# Patient Record
Sex: Female | Born: 1945 | Race: Black or African American | Hispanic: No | Marital: Married | Smoking: Never smoker
Health system: Southern US, Community
[De-identification: ages and names within clinical notes are randomized; demographics above are authoritative.]

## PROBLEM LIST (undated history)

## (undated) HISTORY — PX: BREAST BIOPSY: SHX20

## (undated) HISTORY — PX: ABDOMINAL HYSTERECTOMY: SHX81

---

## 2005-08-24 ENCOUNTER — Ambulatory Visit: Payer: Self-pay

## 2005-09-19 ENCOUNTER — Ambulatory Visit: Payer: Self-pay

## 2006-04-03 ENCOUNTER — Ambulatory Visit: Payer: Self-pay

## 2006-04-11 IMAGING — US ULTRASOUND LEFT BREAST
1 series · 14 of 14 positions shown · non-contrast
Comparison: none

REASON FOR EXAM: Density  US PRN
COMMENTS:

[Series 1: ultrasound left breast · 14 of 14 slices shown]
[im 1/14]
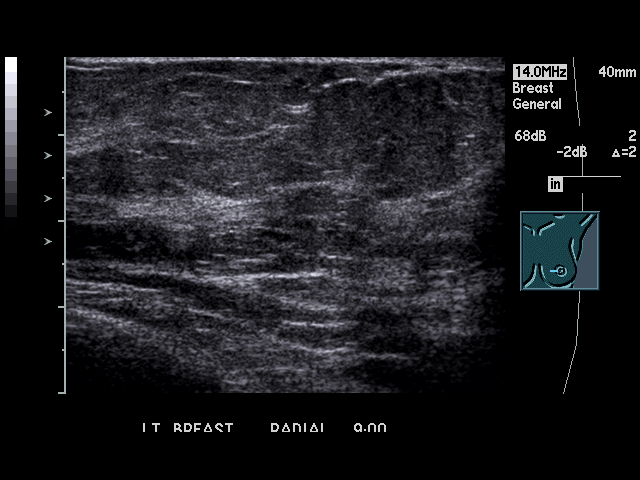
[im 2/14]
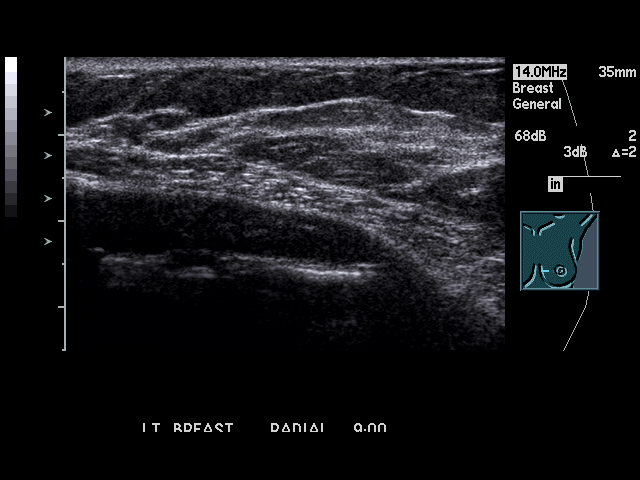
[im 3/14]
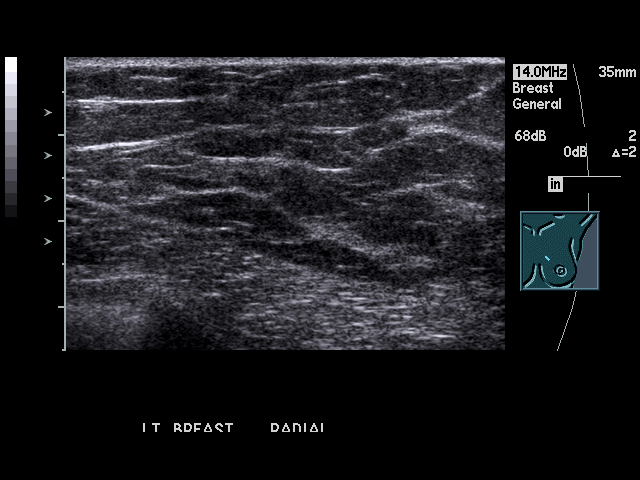
[im 4/14]
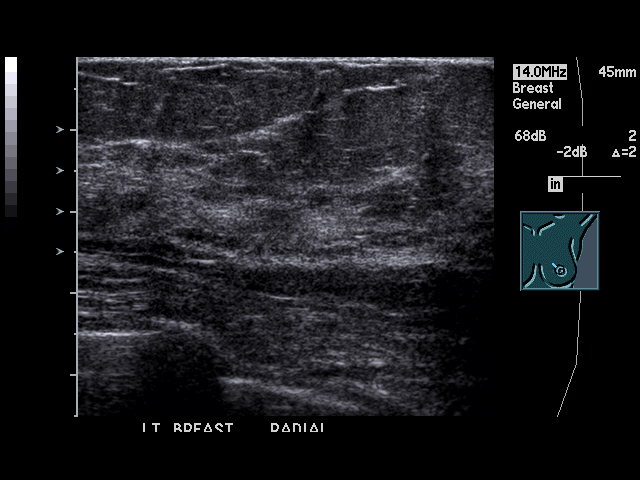
[im 5/14]
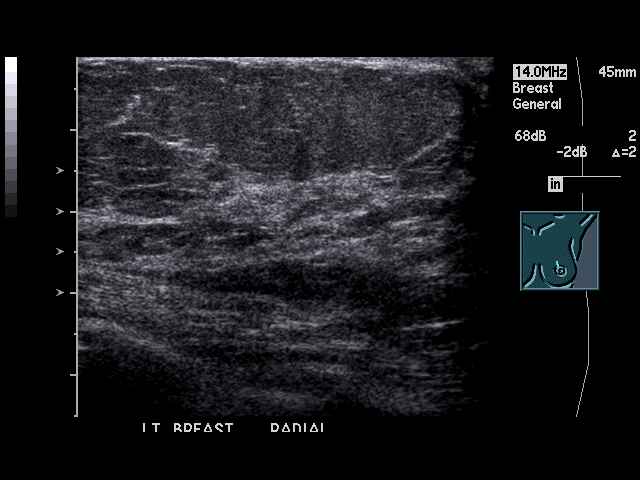
[im 6/14]
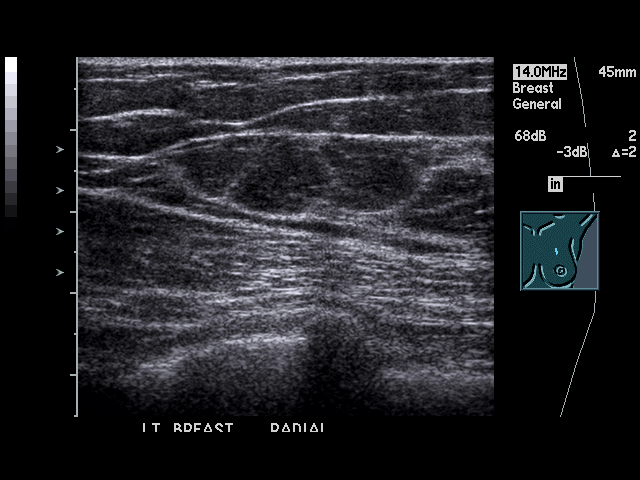
[im 7/14]
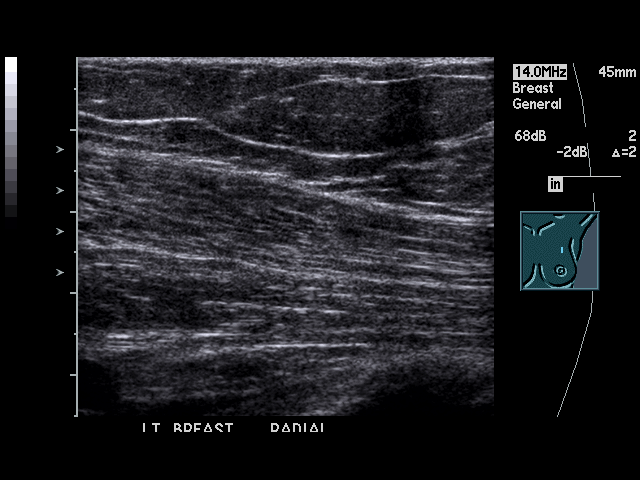
[im 8/14]
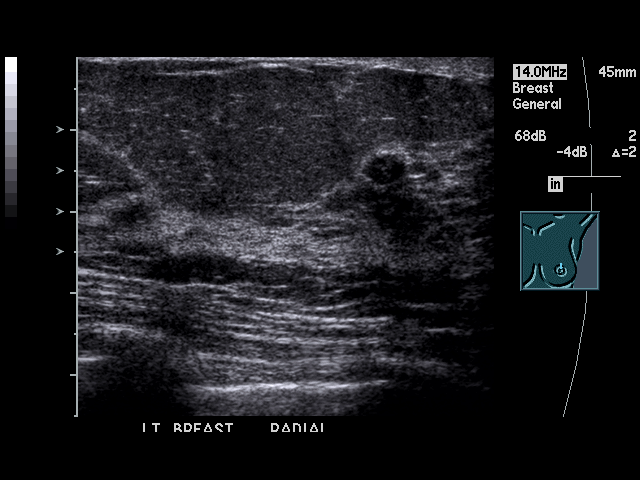
[im 9/14]
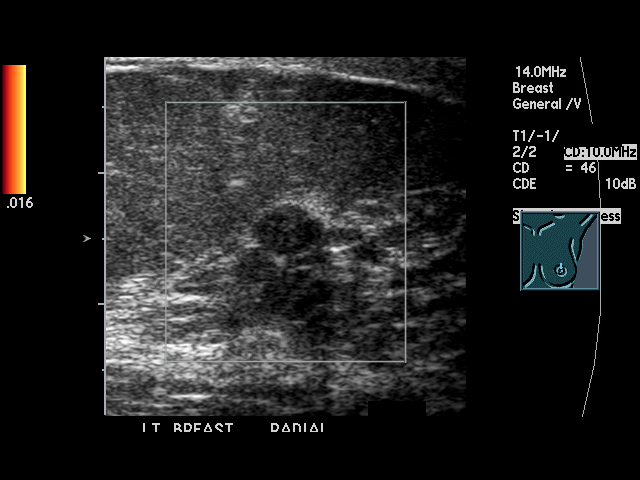
[im 10/14]
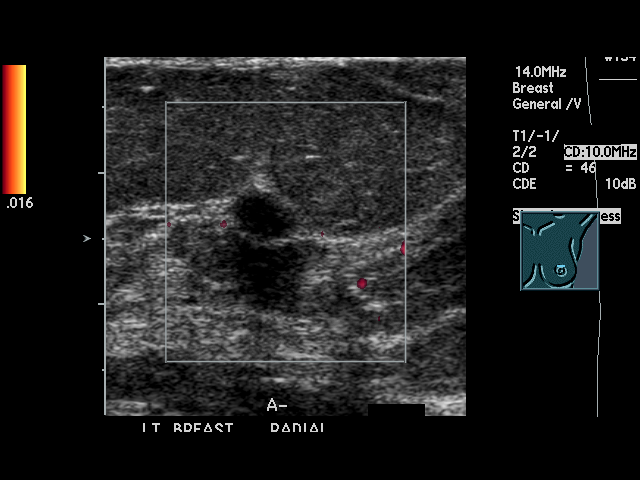
[im 11/14]
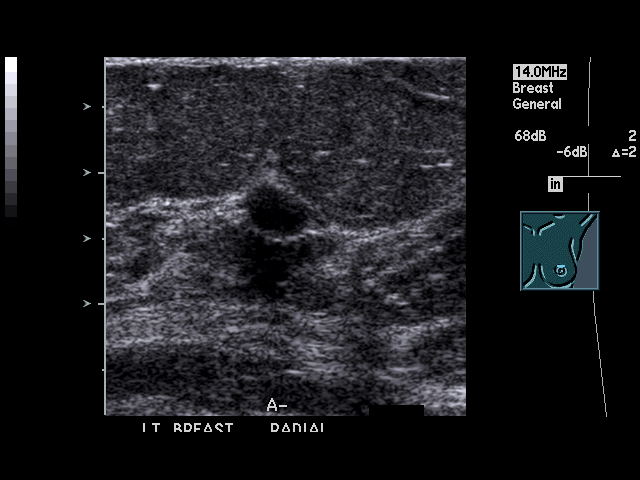
[im 12/14]
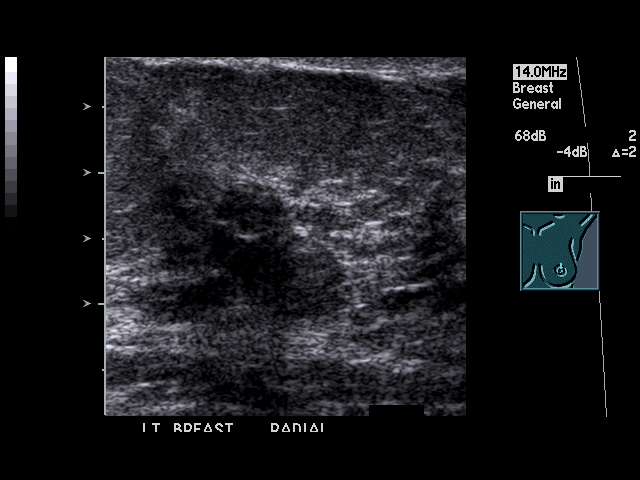
[im 13/14]
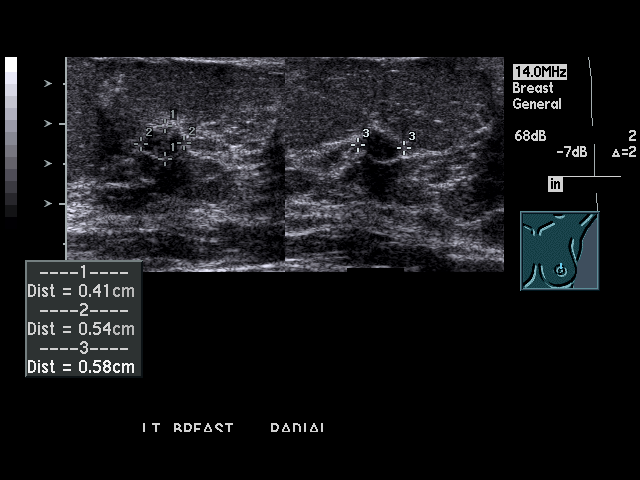
[im 14/14]
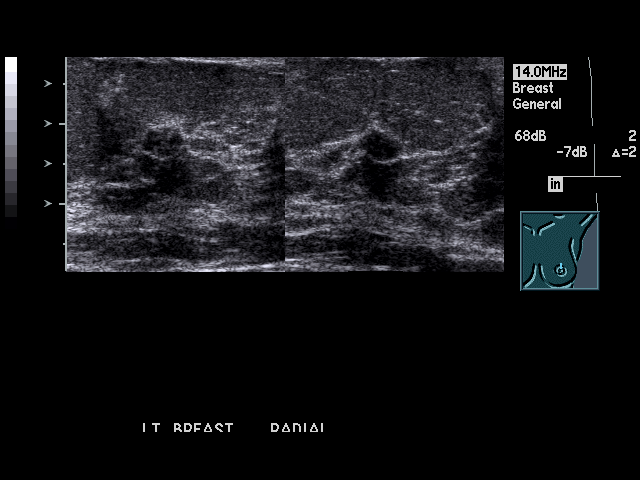

[14 of 14 positions shown; findings below may reference images not displayed]

PROCEDURE:     US  - US LT BREAST ([REDACTED])  - September 19, 2005 [DATE]

RESULT:          Limited sonographic evaluation of the LEFT breast
demonstrates a fairly well circumscribed area of hypoechogenicity measuring
approximately 4.1 x 5.4 x 5.8 mm at approximately the 12 o'clock position in
the deeper portion of the LEFT breast essentially at the level of the
nipple.  On power Doppler interrogation, no definite signal is seen within
the lesion, but there is a suggestion of some shadowing deeper to it.
IMPRESSION: BI-RADS:  Category 4 - Suspicious abnormality.  The
lesion sonographically has an indeterminate appearance but is of concern
mammographically.

## 2006-09-26 ENCOUNTER — Ambulatory Visit: Payer: Self-pay

## 2006-10-10 ENCOUNTER — Ambulatory Visit: Payer: Self-pay | Admitting: Unknown Physician Specialty

## 2007-03-21 ENCOUNTER — Ambulatory Visit: Payer: Self-pay

## 2007-05-02 IMAGING — US US PELV - US TRANSVAGINAL
1 series · 17 of 25 positions shown · non-contrast
Comparison: none

REASON FOR EXAM: pelvic mass
COMMENTS:

[Series 1: us pelv - us transvaginal · 17 of 27 slices shown]
[im 1/27]
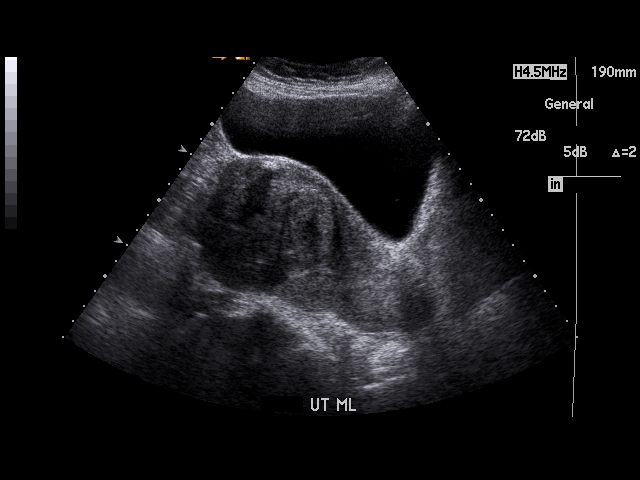
[im 3/27]
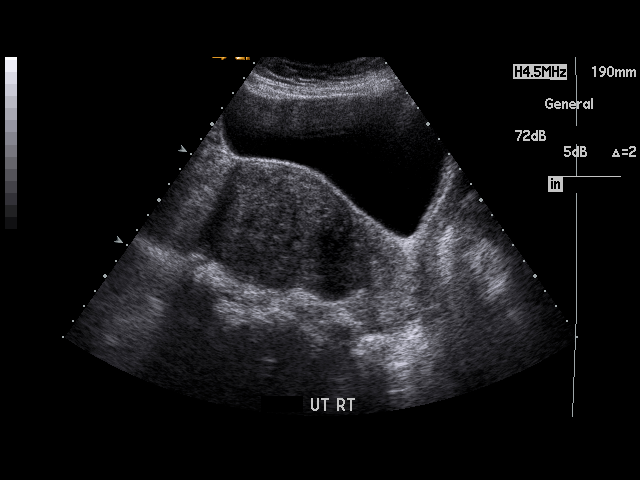
[im 4/27]
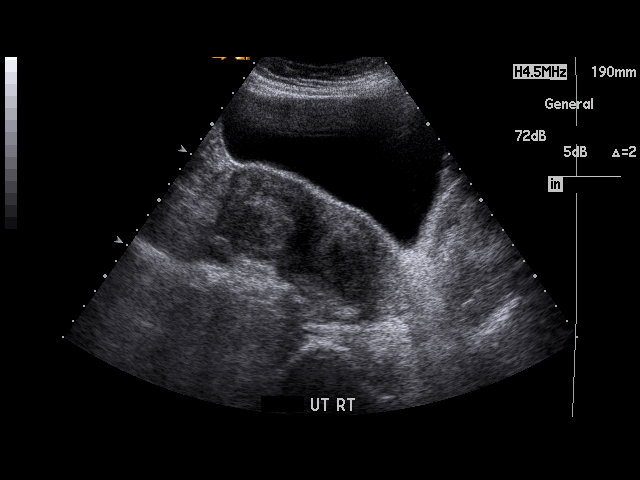
[im 6/27]
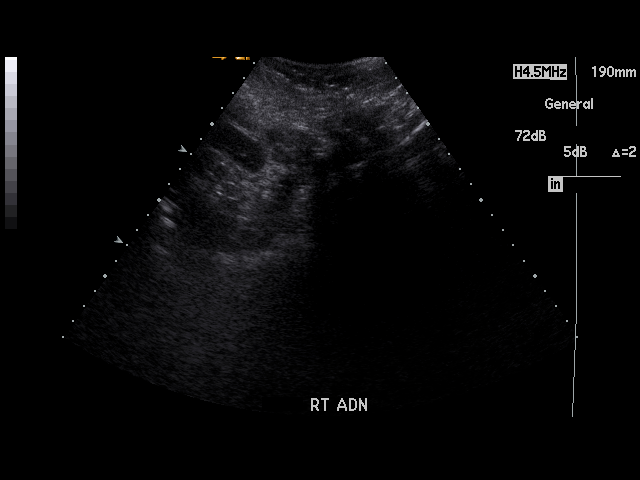
[im 7/27]
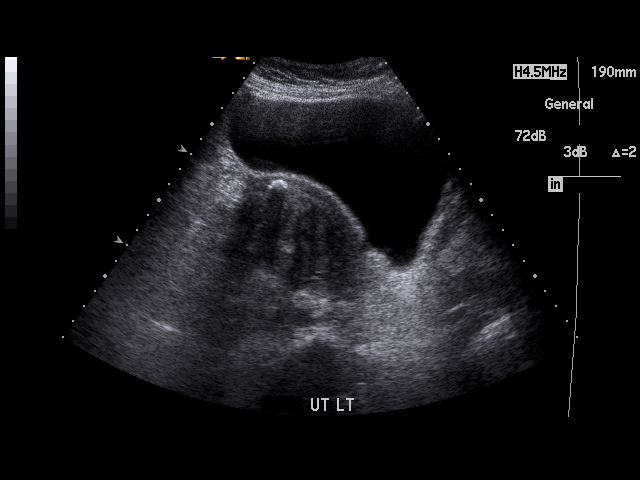
[im 9/27]
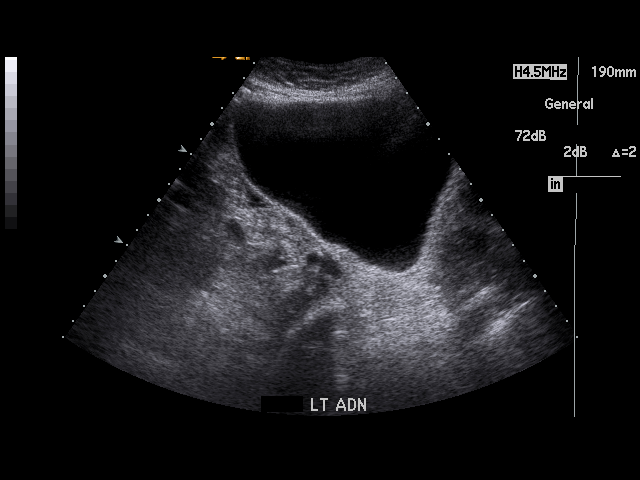
[im 10/27]
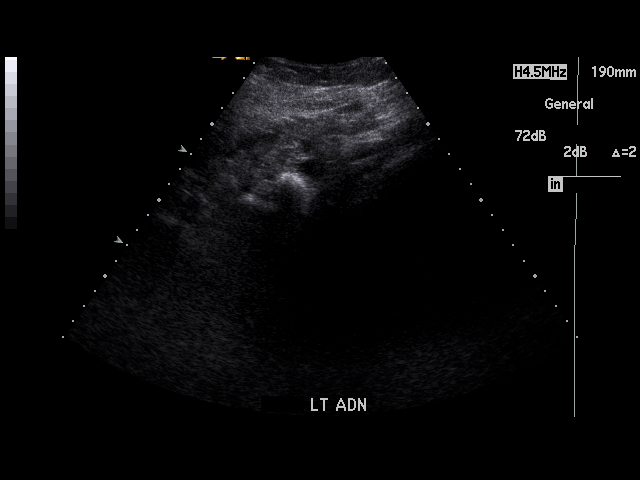
[im 12/27]
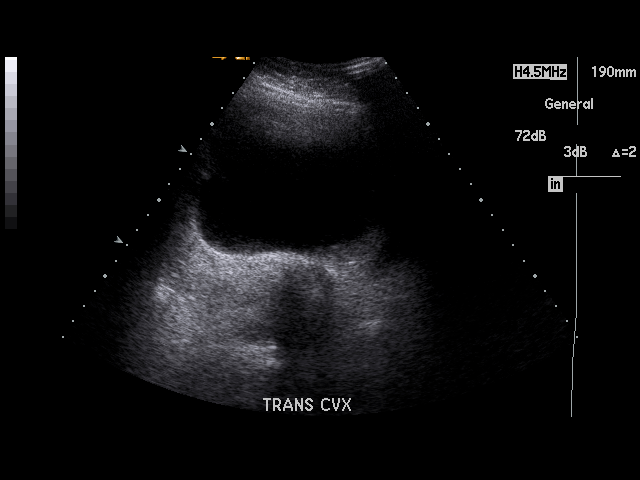
[im 14/27]
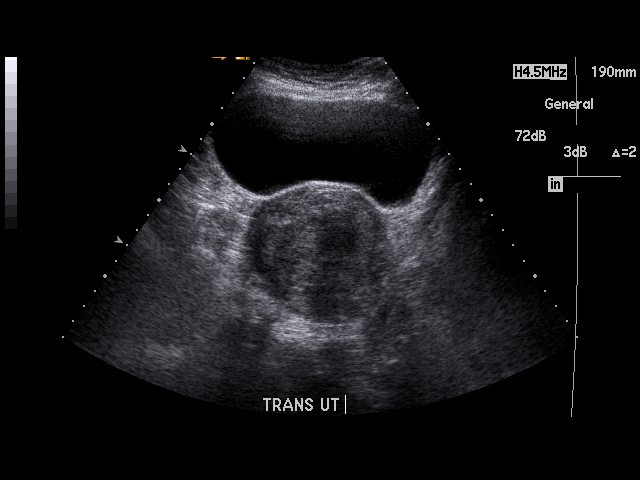
[im 15/27]
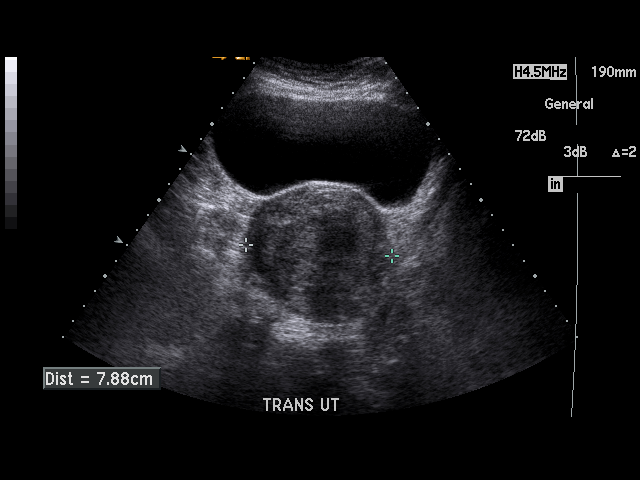
[im 17/27]
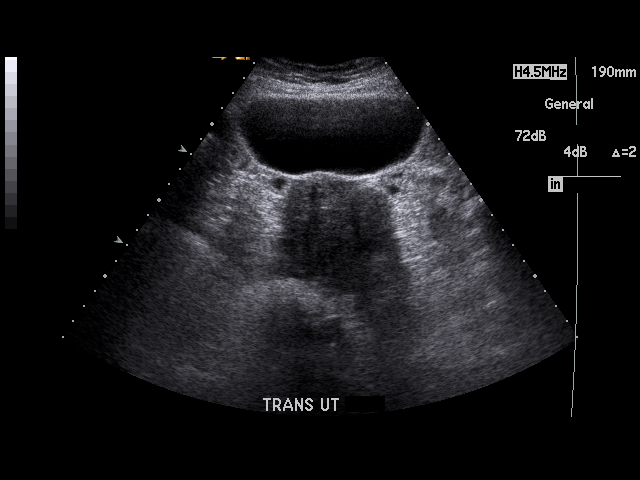
[im 18/27]
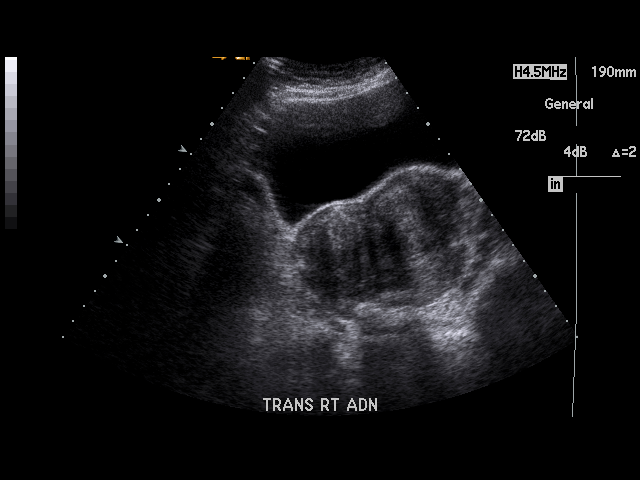
[im 20/27]
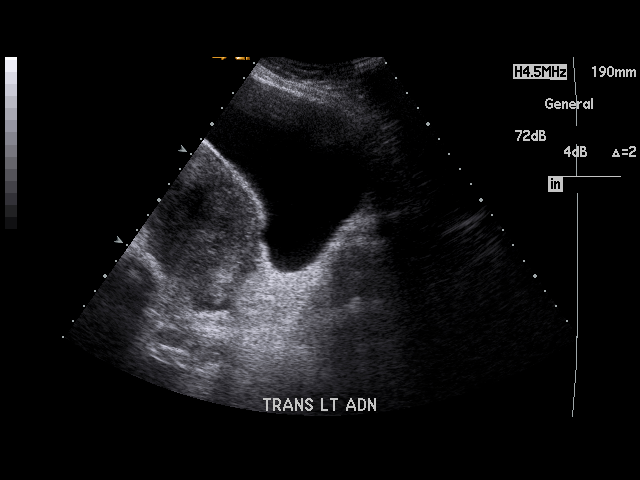
[im 21/27]
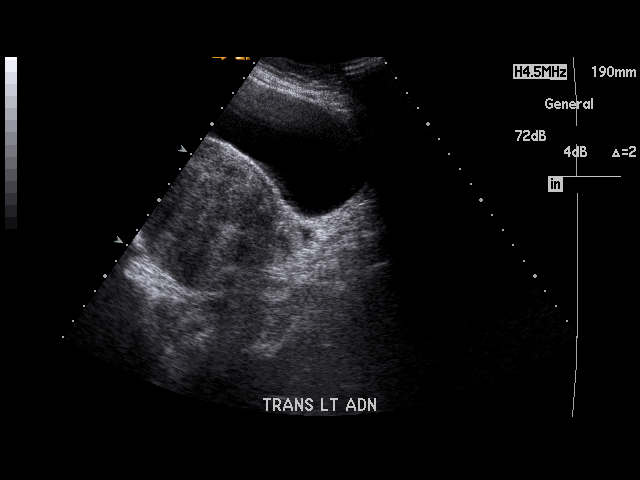
[im 23/27]
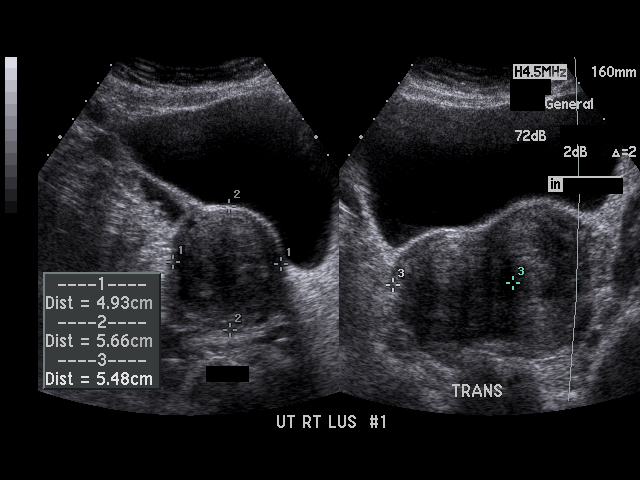
[im 24/27]
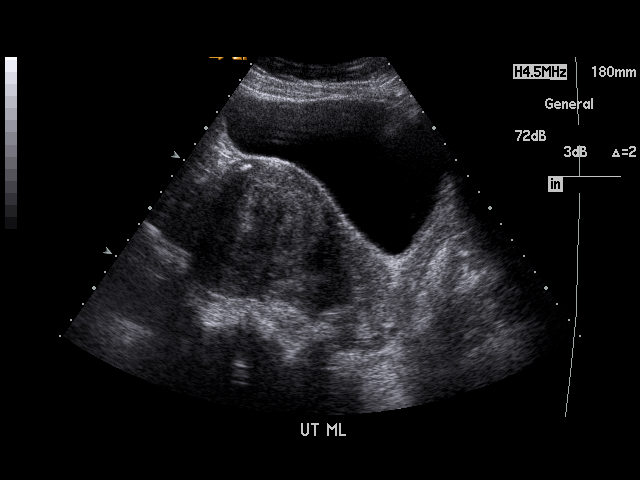
[im 27/27]
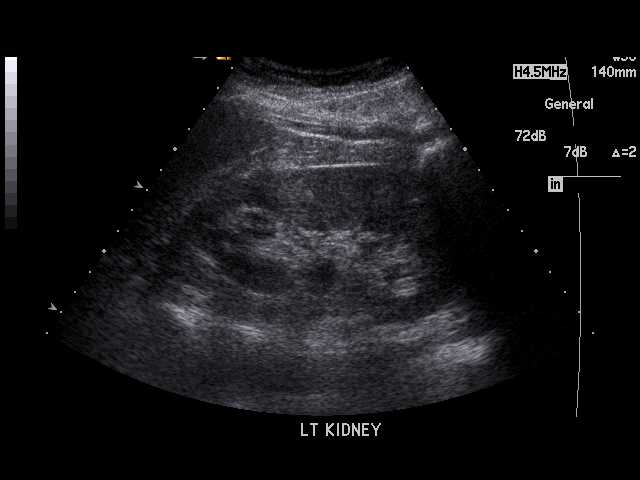

[17 of 25 positions shown; findings below may reference images not displayed]

PROCEDURE:     US  - US PELVIS MASS EXAM  - [DATE]  [DATE] [DATE] [DATE]

RESULT:     The uterus is mildly enlarged measuring 13.7 cm in length by
x 7.3 cm. Its echotexture is heterogeneous likely secondary to the presence
of multiple fibroids. The endometrial stripe could not be adequately
demonstrated due to shadowing. The adnexal structures could not be
discretely identified which is not unusual for a patient of 60 years. There
is no free fluid in the pelvis.
IMPRESSION: 1. The uterus is enlarged and heterogeneous in echotexture. There are likely
multiple fibroids. One which could be measured was approximately 5.0 cm in
diameter. MRI may be of value.
2. No adnexal masses were seen but the ovaries could not be discretely
identified. There is no free fluid in the pelvis.

## 2008-01-04 ENCOUNTER — Ambulatory Visit: Payer: Self-pay | Admitting: Unknown Physician Specialty

## 2008-01-21 ENCOUNTER — Encounter: Admission: RE | Admit: 2008-01-21 | Discharge: 2008-01-21 | Payer: Self-pay | Admitting: Family Medicine

## 2016-03-22 ENCOUNTER — Other Ambulatory Visit: Payer: Self-pay | Admitting: Family Medicine

## 2016-03-22 DIAGNOSIS — Z1231 Encounter for screening mammogram for malignant neoplasm of breast: Secondary | ICD-10-CM

## 2016-04-06 ENCOUNTER — Ambulatory Visit: Admission: RE | Admit: 2016-04-06 | Payer: Self-pay | Source: Ambulatory Visit

## 2016-05-03 ENCOUNTER — Other Ambulatory Visit: Payer: Self-pay | Admitting: Family Medicine

## 2016-05-03 ENCOUNTER — Ambulatory Visit
Admission: RE | Admit: 2016-05-03 | Discharge: 2016-05-03 | Disposition: A | Discharge status: Home / Self Care | Payer: Medicare Other | Source: Ambulatory Visit | Attending: Family Medicine | Admitting: Family Medicine

## 2016-05-03 DIAGNOSIS — Z1231 Encounter for screening mammogram for malignant neoplasm of breast: Secondary | ICD-10-CM

## 2016-05-09 ENCOUNTER — Ambulatory Visit
Admission: RE | Admit: 2016-05-09 | Discharge: 2016-05-09 | Disposition: A | Discharge status: Home / Self Care | Payer: Self-pay | Source: Ambulatory Visit | Attending: *Deleted | Admitting: *Deleted

## 2016-05-09 ENCOUNTER — Other Ambulatory Visit: Payer: Self-pay | Admitting: *Deleted

## 2016-05-09 DIAGNOSIS — Z9289 Personal history of other medical treatment: Secondary | ICD-10-CM

## 2017-10-24 ENCOUNTER — Other Ambulatory Visit: Payer: Self-pay

## 2017-10-24 ENCOUNTER — Emergency Department
Admission: EM | Admit: 2017-10-24 | Discharge: 2017-10-24 | Disposition: A | Discharge status: Home / Self Care | Payer: Medicare Other | Attending: Emergency Medicine | Admitting: Emergency Medicine

## 2017-10-24 ENCOUNTER — Emergency Department
Admission: EM | Admit: 2017-10-24 | Discharge: 2017-10-24 | Discharge status: Absent without leave | Payer: Medicare Other

## 2017-10-24 ENCOUNTER — Encounter: Payer: Self-pay | Admitting: Emergency Medicine

## 2017-10-24 ENCOUNTER — Emergency Department: Payer: Medicare Other

## 2017-10-24 DIAGNOSIS — R51 Headache: Secondary | ICD-10-CM | POA: Diagnosis present

## 2017-10-24 MED ORDER — CYCLOBENZAPRINE HCL 5 MG PO TABS: 5.0000 mg | ORAL_TABLET | Freq: Three times a day (TID) | ORAL | 0 refills | Status: AC | PRN

## 2017-10-24 MED ORDER — ACETAMINOPHEN 325 MG PO TABS
650.0000 mg | ORAL_TABLET | Freq: Once | ORAL | Status: AC
Start: 2017-10-24 — End: 2017-10-24
  Administered 2017-10-24: 650 mg via ORAL
  Filled 2017-10-24: qty 2
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
## 2017-10-25 ENCOUNTER — Encounter: Payer: Self-pay | Admitting: Emergency Medicine

## 2019-04-11 DEATH — deceased
